# Patient Record
Sex: Female | Born: 1995 | Race: White | Hispanic: No | Marital: Single | Smoking: Never smoker
Health system: Southern US, Community
[De-identification: ages and names within clinical notes are randomized; demographics above are authoritative.]

## PROBLEM LIST (undated history)

## (undated) DIAGNOSIS — N920 Excessive and frequent menstruation with regular cycle: Secondary | ICD-10-CM

## (undated) DIAGNOSIS — D649 Anemia, unspecified: Secondary | ICD-10-CM

## (undated) DIAGNOSIS — R109 Unspecified abdominal pain: Secondary | ICD-10-CM

## (undated) DIAGNOSIS — H539 Unspecified visual disturbance: Secondary | ICD-10-CM

## (undated) DIAGNOSIS — F319 Bipolar disorder, unspecified: Secondary | ICD-10-CM

## (undated) HISTORY — PX: TYMPANOSTOMY TUBE PLACEMENT: SHX32

## (undated) HISTORY — DX: Unspecified abdominal pain: R10.9

## (undated) HISTORY — DX: Bipolar disorder, unspecified: F31.9

## (undated) HISTORY — PX: TOOTH EXTRACTION: SUR596

---
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
## 2015-04-13 ENCOUNTER — Ambulatory Visit (HOSPITAL_COMMUNITY): Payer: Self-pay | Admitting: Psychiatry

## 2015-07-23 ENCOUNTER — Encounter (HOSPITAL_COMMUNITY): Payer: Self-pay | Admitting: Nurse Practitioner

## 2015-07-23 ENCOUNTER — Emergency Department (HOSPITAL_COMMUNITY): Payer: 59

## 2015-07-23 ENCOUNTER — Emergency Department (HOSPITAL_COMMUNITY)
Admission: EM | Admit: 2015-07-23 | Discharge: 2015-07-23 | Disposition: A | Discharge status: Home / Self Care | Payer: 59 | Attending: Physician Assistant | Admitting: Physician Assistant

## 2015-07-23 DIAGNOSIS — Y998 Other external cause status: Secondary | ICD-10-CM | POA: Insufficient documentation

## 2015-07-23 DIAGNOSIS — F319 Bipolar disorder, unspecified: Secondary | ICD-10-CM | POA: Insufficient documentation

## 2015-07-23 DIAGNOSIS — Z79899 Other long term (current) drug therapy: Secondary | ICD-10-CM | POA: Insufficient documentation

## 2015-07-23 DIAGNOSIS — Y9289 Other specified places as the place of occurrence of the external cause: Secondary | ICD-10-CM | POA: Insufficient documentation

## 2015-07-23 DIAGNOSIS — Z8669 Personal history of other diseases of the nervous system and sense organs: Secondary | ICD-10-CM | POA: Insufficient documentation

## 2015-07-23 DIAGNOSIS — Y9339 Activity, other involving climbing, rappelling and jumping off: Secondary | ICD-10-CM | POA: Insufficient documentation

## 2015-07-23 DIAGNOSIS — Z862 Personal history of diseases of the blood and blood-forming organs and certain disorders involving the immune mechanism: Secondary | ICD-10-CM | POA: Insufficient documentation

## 2015-07-23 DIAGNOSIS — R0789 Other chest pain: Secondary | ICD-10-CM

## 2015-07-23 DIAGNOSIS — S299XXA Unspecified injury of thorax, initial encounter: Secondary | ICD-10-CM | POA: Insufficient documentation

## 2015-07-23 DIAGNOSIS — X58XXXA Exposure to other specified factors, initial encounter: Secondary | ICD-10-CM | POA: Insufficient documentation

## 2015-07-23 MED ORDER — IBUPROFEN 800 MG PO TABS: 800.0000 mg | ORAL_TABLET | Freq: Three times a day (TID) | ORAL | Status: AC

## 2015-07-23 MED ORDER — IBUPROFEN 800 MG PO TABS
800.0000 mg | ORAL_TABLET | Freq: Once | ORAL | Status: AC
  Administered 2015-07-23: 800 mg via ORAL
  Filled 2015-07-23: qty 1

## 2015-07-23 NOTE — Discharge Instructions (Signed)
1. Medications: ibuprofen, usual home medications 2. Treatment: rest, drink plenty of fluids, ice 3. Follow Up: please followup with your primary doctor for discussion of your diagnoses and further evaluation after today's visit; please return to the ER for severe pain, shortness of breath, new or worsening symptoms   Chest Wall Pain Chest wall pain is pain in or around the bones and muscles of your chest. Sometimes, an injury causes this pain. Sometimes, the cause may not be known. This pain may take several weeks or longer to get better. HOME CARE INSTRUCTIONS  Pay attention to any changes in your symptoms. Take these actions to help with your pain:   Rest as told by your health care provider.   Avoid activities that cause pain. These include any activities that use your chest muscles or your abdominal and side muscles to lift heavy items.   If directed, apply ice to the painful area:  Put ice in a plastic bag.  Place a towel between your skin and the bag.  Leave the ice on for 20 minutes, 2-3 times per day.  Take over-the-counter and prescription medicines only as told by your health care provider.  Do not use tobacco products, including cigarettes, chewing tobacco, and e-cigarettes. If you need help quitting, ask your health care provider.  Keep all follow-up visits as told by your health care provider. This is important. SEEK MEDICAL CARE IF:  You have a fever.  Your chest pain becomes worse.  You have new symptoms. SEEK IMMEDIATE MEDICAL CARE IF:  You have nausea or vomiting.  You feel sweaty or light-headed.  You have a cough with phlegm (sputum) or you cough up blood.  You develop shortness of breath.   This information is not intended to replace advice given to you by your health care provider. Make sure you discuss any questions you have with your health care provider.   Document Released: 09/02/2005 Document Revised: 05/24/2015 Document Reviewed:  11/28/2014 Elsevier Interactive Patient Education [employer]2016 Elsevier Inc.

## 2015-07-23 NOTE — ED Provider Notes (Signed)
CSN: [id number]     Arrival date & time 07/23/15  1349 History  By signing my name below, I, Elon Spanner, attest that this documentation has been prepared under the direction and in the presence of Glean Hess, [place]. Electronically Signed: Elon Spanner ED Scribe. 07/23/2015. 4:42 PM.    Chief Complaint  Patient presents with  . Flank Pain    The history is provided by the patient. No language interpreter was used.    HPI Comments: Karla Hill is a 19 y.o. female who presents to the Emergency Department complaining of left rib pain onset 1 hour ago after being tackled while playing quidditch. Her pain is worse with movement and deep inspiration. She has not tried anything for symptom relief. She reports she tore her rib cartilage 1 month ago while playing quidditch. She denies head trauma, LOC, additional injury, abdominal pain, n/v, shortness of breath, numbness, paresthesia, weakness, neck pain, back pain.     Past Medical History  Diagnosis Date  . Abdominal pain   . Anemia   . Vision abnormalities     wears glassess  . Menorrhagia     anemia- takes Safyral  . Bipolar disorder [facility])    Past Surgical History  Procedure Laterality Date  . Tympanostomy tube placement    . Tooth extraction      4 for braces.  . Esophagogastroduodenoscopy N/A 01/08/2013    Procedure: ESOPHAGOGASTRODUODENOSCOPY (EGD);  Surgeon: Jon Gills, MD;  Location[facility] OR;  Service: Gastroenterology;  Laterality: N/A;   Family History  Problem Relation Age of Onset  . Thyroid disease Mother   . Migraines Mother   . Hyperlipidemia Mother   . Hypertension Mother   . GER disease Father   . Ulcers Paternal Uncle   . Alcohol abuse Maternal Aunt   . Drug abuse Maternal Aunt   . Depression Maternal Aunt   . Mental illness Maternal Aunt   . Heart disease Maternal Grandmother    Social History  Substance Use Topics  . Smoking status: Never Smoker   . Smokeless tobacco: Never Used  . Alcohol  Use: Yes   OB History    No data available       Review of Systems  Respiratory: Negative for shortness of breath.   Cardiovascular: Negative for chest pain.  Gastrointestinal: Negative for nausea, vomiting and abdominal pain.  Musculoskeletal: Positive for arthralgias. Negative for back pain and neck pain.  Neurological: Negative for syncope, weakness and numbness.      Allergies  Review of patient's allergies indicates no known allergies.  Home Medications   Prior to Admission medications   Medication Sig Start Date End Date Taking? Authorizing Provider  Alum & Mag Hydroxide-Simeth (MAGIC MOUTHWASH W/LIDOCAINE) SOLN Take 5 mLs by mouth 4 (four) times daily as needed for mouth pain. 07/22/13   Shade Flood, MD  divalproex (DEPAKOTE) 500 MG DR tablet Take 500 mg by mouth 3 (three) times daily.    Historical Provider, MD  Drospirenone-Ethinyl Estradiol-Levomefol (SAFYRAL) 3-0.03-0.451 MG tablet Take 1 tablet by mouth daily.    Historical Provider, MD  esomeprazole (NEXIUM) 40 MG capsule Take 1 capsule (40 mg total) by mouth daily before breakfast. 04/28/13   Jon Gills, MD  HYDROcodone-acetaminophen (NORCO/VICODIN) 5-325 MG per tablet Take 1 tablet by mouth every 6 (six) hours as needed for moderate pain. 07/22/13   Shade Flood, MD  ibuprofen (ADVIL,MOTRIN) 800 MG tablet Take 1 tablet (800 mg total) by mouth  3 (three) times daily. 07/23/15   Mady GemmaElizabeth C Westfall, PA-C  lamoTRIgine (LAMICTAL) 100 MG tablet Take 100 mg by mouth daily.    Historical Provider, MD    BP 108/59 mmHg  Pulse 77  Temp(Src) 97.9 F (36.6 C) (Oral)  Resp 16  SpO2 99%  LMP 07/23/2015 Physical Exam  Constitutional: She is oriented to person, place, and time. She appears well-developed and well-nourished. No distress.  HENT:  Head: Normocephalic and atraumatic.  Right Ear: External ear normal.  Left Ear: External ear normal.  Nose: Nose normal.  Mouth/Throat: Oropharynx is clear and moist.   Eyes: Conjunctivae and EOM are normal. Pupils are equal, round, and reactive to light. Right eye exhibits no discharge. Left eye exhibits no discharge. No scleral icterus.  Neck: Normal range of motion. Neck supple.  Cardiovascular: Normal rate, regular rhythm, normal heart sounds and intact distal pulses.   Pulmonary/Chest: Effort normal and breath sounds normal. No respiratory distress. She has no wheezes. She has no rales. She exhibits tenderness and bony tenderness. She exhibits no crepitus, no edema, no deformity, no swelling and no retraction.    Abdominal: Soft. Bowel sounds are normal. She exhibits no distension and no mass. There is no tenderness. There is no rebound and no guarding.  Musculoskeletal: She exhibits no edema or tenderness.  Decreased ROM of LUE due to pain in left chest wall.  Neurological: She is alert and oriented to person, place, and time. She has normal strength. No sensory deficit.  Skin: Skin is warm and dry. She is not diaphoretic.  Psychiatric: She has a normal mood and affect. Her behavior is normal.  Nursing note and vitals reviewed.   ED Course  Procedures (including critical care time)  DIAGNOSTIC STUDIES: Oxygen Saturation is 99% on RA, normal by my interpretation.    COORDINATION OF CARE: 3:44 PM Will order imaging.  Patient acknowledges and agrees with plan.    Labs Review Labs Reviewed - No data to display  Imaging Review Dg Ribs Unilateral W/chest Left  07/23/2015  CLINICAL DATA:  Patient with left lateral rib pain status post fall during soccer game. Initial encounter. EXAM: LEFT RIBS AND CHEST - 3+ VIEW COMPARISON:  None. FINDINGS: Normal cardiac and mediastinal contours. No consolidative pulmonary opacities. No pleural effusion or pneumothorax. Regional skeleton is unremarkable. No evidence for acute displaced left lateral rib fracture. IMPRESSION: No acute cardiopulmonary process. No evidence for acute displaced left lateral rib fracture.  Electronically Signed   By: Annia Beltrew  Davis M.D.   On: 07/23/2015 16:19     I have personally reviewed and evaluated these images as part of my medical decision-making.   EKG Interpretation None      MDM   Final diagnoses:  Left-sided chest wall pain    19 year old female presents with left rib pain after being tackled while playing quidditch. Denies head trauma, LOC, additional injury, abdominal pain, n/v, numbness, paresthesia, weakness, neck pain, back pain.  Patient is afebrile. Vital signs stable. Heart RRR. Lungs clear to auscultation bilaterally. TTP of left lateral chest wall over mid-axillary line. No crepitus or palpable deformity. Decreased range of motion of left arm due to pain in left chest wall. Strength and sensation intact. Distal pulses intact.  Given ibuprofen and ice for pain. Will obtain imaging of left ribs and chest. Imaging negative for opacity, pleural effusion, pneumothorax, or acute displaced left lateral rib fracture.  Advised patient to continue ice and ibuprofen for pain and inflammation. Return  precautions discussed. Patient to follow-up with PCP. Patient verbalizes her understanding and is in agreement with plan.  BP 108/59 mmHg  Pulse 77  Temp(Src) 97.9 F (36.6 C) (Oral)  Resp 16  SpO2 99%  LMP 07/23/2015  I personally performed the services described in this documentation, which was scribed in my presence. The recorded information has been reviewed and is accurate.    Mady Gemma, PA-C 07/23/15 1642  Courteney Randall An, MD 07/23/15 2123

## 2015-07-23 NOTE — ED Notes (Signed)
Pt c/o left sided flank pain secondary to being hit while playing quidditch sport, states she had a cartiladge tear on the same side. Denies shortness of breath or chest pain.

## 2016-02-01 ENCOUNTER — Other Ambulatory Visit: Payer: Self-pay | Admitting: Obstetrics and Gynecology

## 2016-08-11 IMAGING — CR DG RIBS W/ CHEST 3+V*L*
3 series · 3 of 3 positions shown · non-contrast
Comparison: None.

CLINICAL DATA: Patient with left lateral rib pain status post fall
during soccer game. Initial encounter.

EXAM:
LEFT RIBS AND CHEST - 3+ VIEW

[w chest pa]
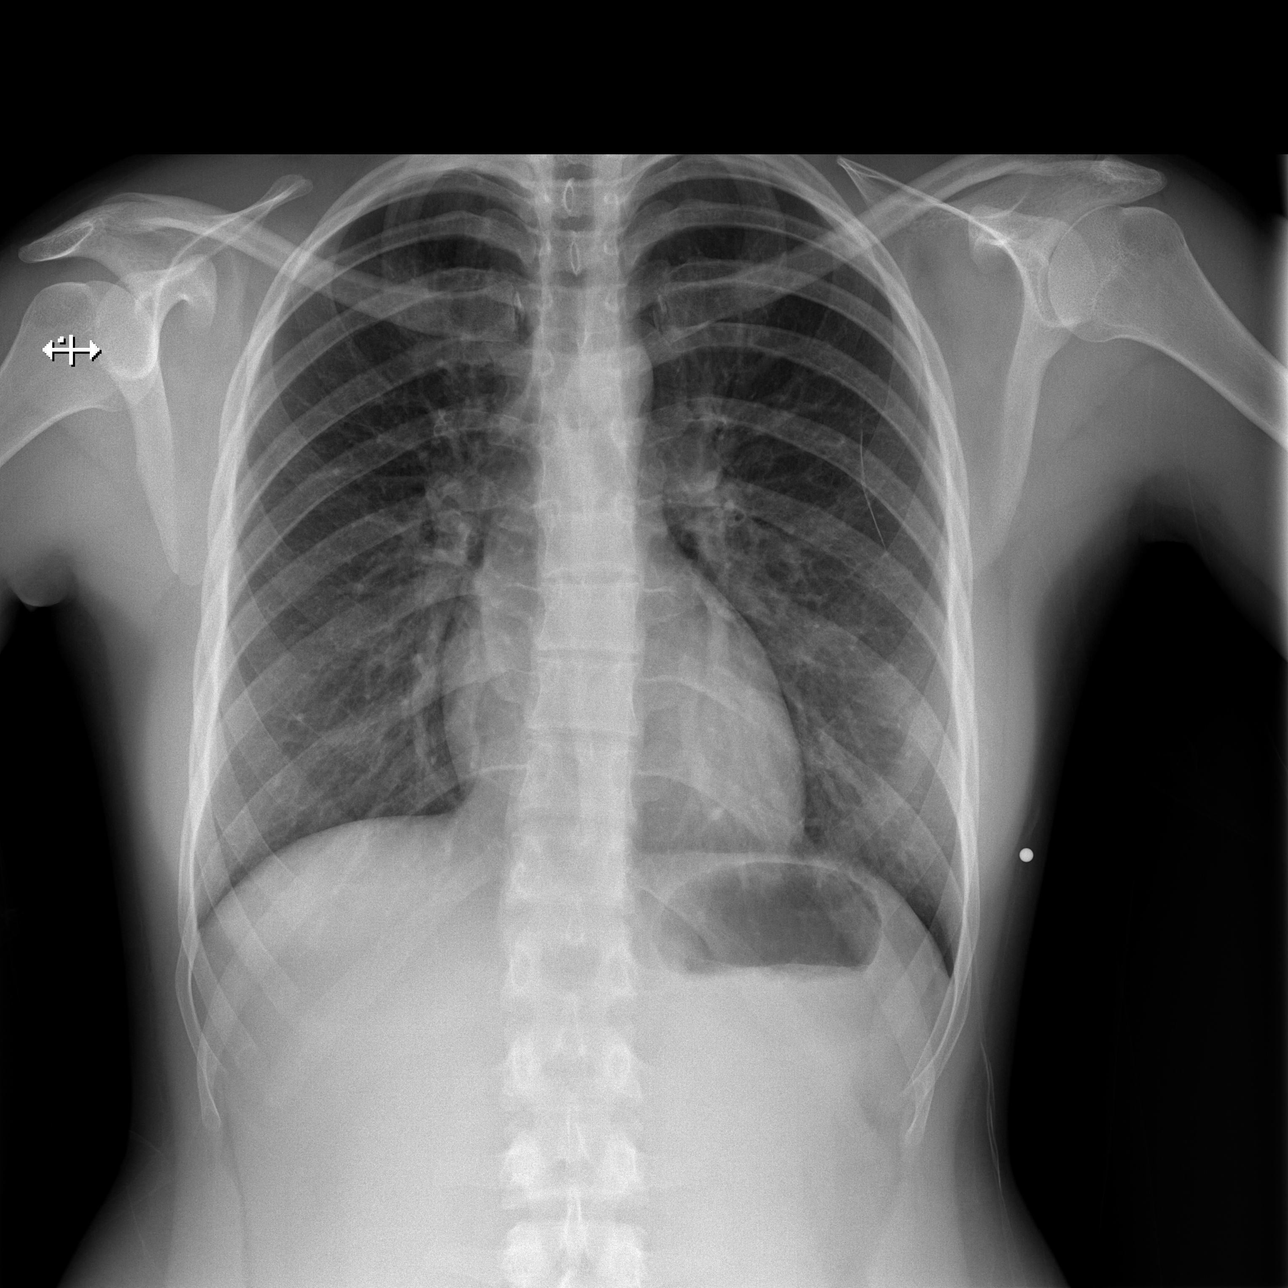

[w ribs ap upper left]
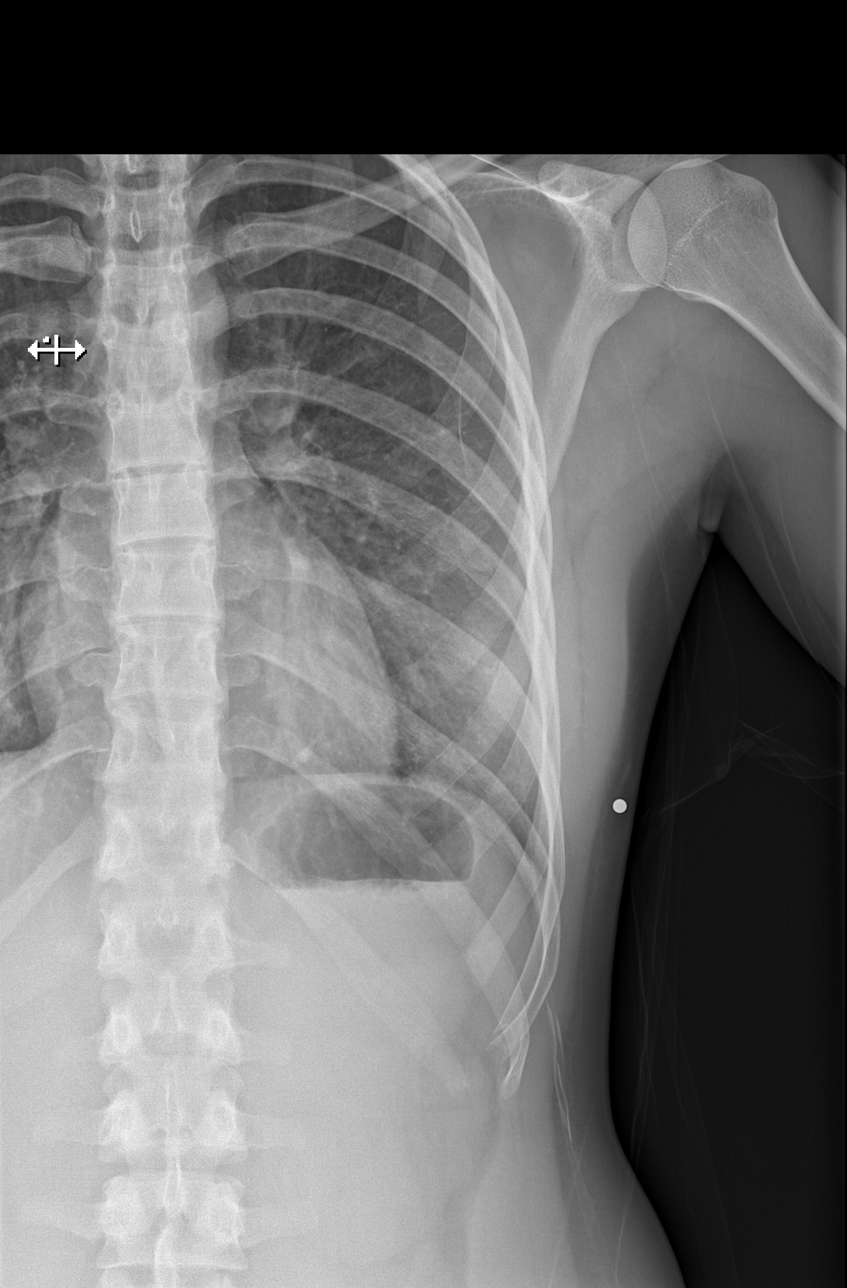

[w ribs ap lower left]
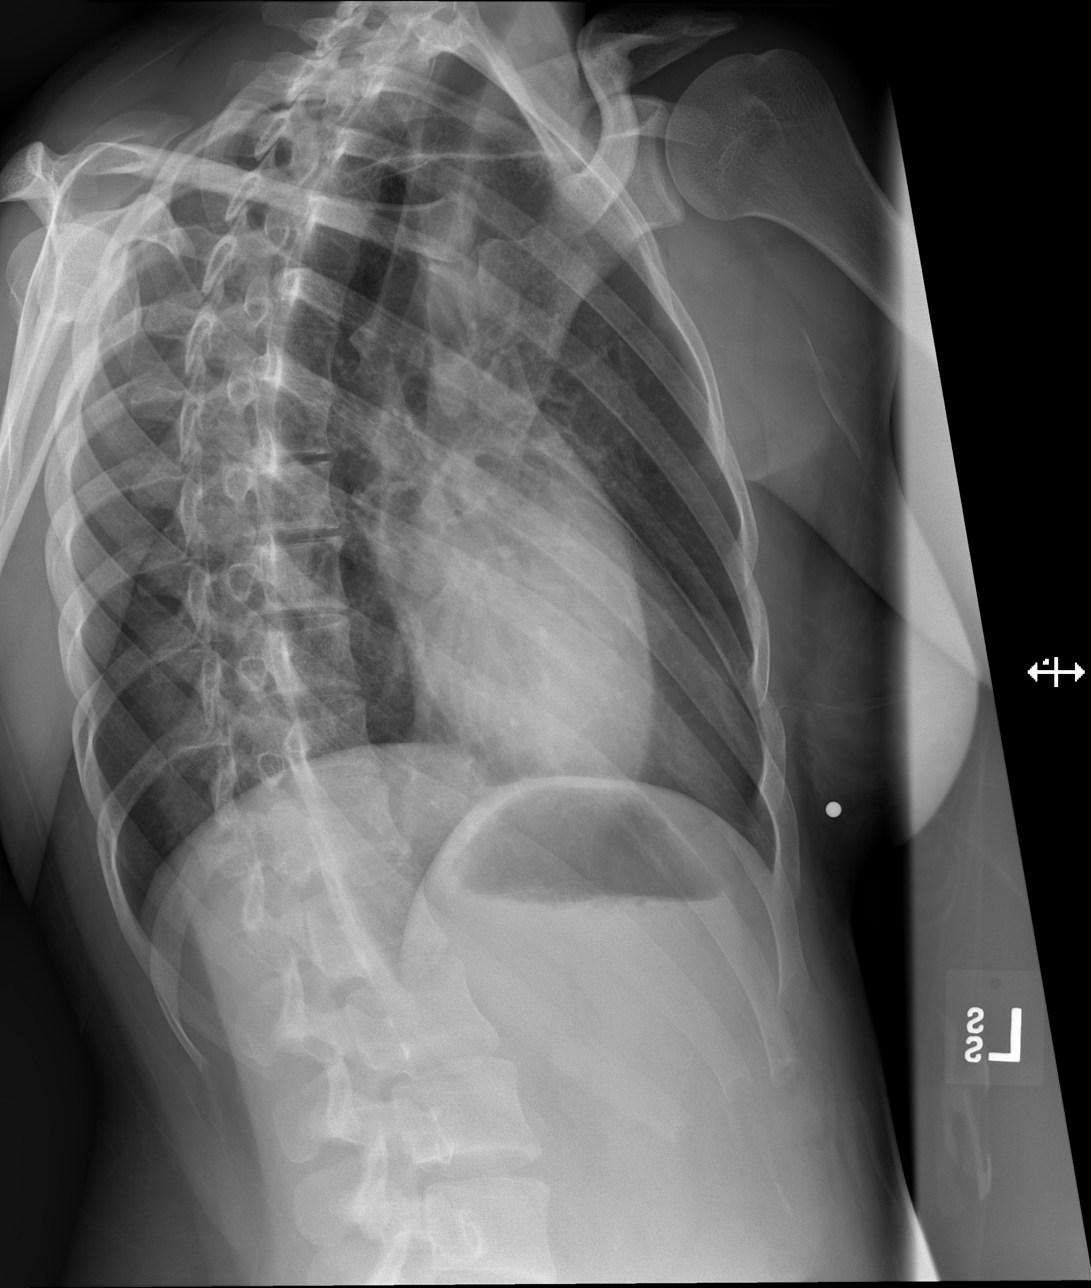

[3 of 3 positions shown; findings below may reference images not displayed]

FINDINGS: Normal cardiac and mediastinal contours. No consolidative pulmonary
opacities. No pleural effusion or pneumothorax. Regional skeleton is
unremarkable. No evidence for acute displaced left lateral rib
fracture.
IMPRESSION: No acute cardiopulmonary process. No evidence for acute displaced
left lateral rib fracture.

## 2018-06-13 ENCOUNTER — Encounter: Payer: Self-pay | Admitting: Behavioral Health

## 2018-06-13 DIAGNOSIS — F3181 Bipolar II disorder: Secondary | ICD-10-CM | POA: Insufficient documentation

## 2018-06-13 DIAGNOSIS — F410 Panic disorder [episodic paroxysmal anxiety] without agoraphobia: Secondary | ICD-10-CM | POA: Insufficient documentation

## 2018-06-29 ENCOUNTER — Ambulatory Visit: Payer: Self-pay | Admitting: Psychiatry

## 2018-07-02 ENCOUNTER — Encounter: Payer: Self-pay | Admitting: Psychiatry

## 2018-07-02 ENCOUNTER — Ambulatory Visit: Payer: 59 | Admitting: Psychiatry

## 2018-07-02 VITALS — BP 118/74 | HR 80 | Ht 64.5 in | Wt 152.0 lb

## 2018-07-02 DIAGNOSIS — F410 Panic disorder [episodic paroxysmal anxiety] without agoraphobia: Secondary | ICD-10-CM | POA: Diagnosis not present

## 2018-07-02 DIAGNOSIS — F3181 Bipolar II disorder: Secondary | ICD-10-CM | POA: Diagnosis not present

## 2018-07-02 MED ORDER — LAMOTRIGINE 200 MG PO TABS: 200.0000 mg | ORAL_TABLET | Freq: Every day | ORAL | 3 refills | Status: DC

## 2018-07-02 NOTE — Progress Notes (Signed)
[facility] Med Check  Patient ID: Karla Hill,  MRN: [id number]  PCP: Karla Lek, MD  Date of Evaluation: 07/02/2018 Time spent:20 minutes   HISTORY/CURRENT STATUS: HPI  Past medications include Depakote, Seroquel, Phenergan, and more recently Xanax not needed over the last year, Geodon, Risperdal, and Lamictal.  Individual Medical History/ Review of Systems: Changes? :Yes. Karla Hill is seen individually face-to-face with consent not collateral for psychiatric interview and exam in 47-month evaluation and management of bipolar II and panic disorder.  She has had no interim panic or required Xanax despite having much stress.  After the death of grandmother last 26-Feb-2023, a family friend died and then mother's best friend has recently had an aneurysmal stroke.  Patient is discussing future marriage with boyfriend and she accepted a job in [place] when offered 2 days ago.  She will finish her college in [place] in the spring and likely move to [place] in May.  She has had recent headaches for the last month or 2 with neck tension and tightness becoming a more significant generalized headache for which she has a plan to see primary care physician who may repeat her prolactin, thyroid and other health screens as she has been on Risperdal 0.25 mg nightly for over the last 10 months.  She asks about minimizing medications for her transfer and future plans.  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  There is maternal family history of bipolar, aggression, and substance abuse.  She continues birth control pill and has had some hypertriglyceridemia as well.  She has no chance of pregnancy and flow to accomplish but has definite plans for health maintenance since PCP Karla Hill moved and retired.  Allergies: Patient has no known allergies.  Current Medications:  Current Outpatient Medications:  .  ALPRAZolam (XANAX) 0.5 MG  tablet, Take 0.5 mg by mouth daily as needed for anxiety., Disp: , Rfl:  .  Alum & Mag Hydroxide-Simeth (MAGIC MOUTHWASH W/LIDOCAINE) SOLN, Take 5 mLs by mouth 4 (four) times daily as needed for mouth pain., Disp: 120 mL, Rfl: 0 .  divalproex (DEPAKOTE) 500 MG DR tablet, Take 500 mg by mouth 3 (three) times daily., Disp: , Rfl:  .  Drospirenone-Ethinyl Estradiol-Levomefol (SAFYRAL) 3-0.03-0.451 MG tablet, Take 1 tablet by mouth daily., Disp: , Rfl:  .  esomeprazole (NEXIUM) 40 MG capsule, Take 1 capsule (40 mg total) by mouth daily before breakfast., Disp: 30 capsule, Rfl: 5 .  HYDROcodone-acetaminophen (NORCO/VICODIN) 5-325 MG per tablet, Take 1 tablet by mouth every 6 (six) hours as needed for moderate pain., Disp: 15 tablet, Rfl: 0 .  ibuprofen (ADVIL,MOTRIN) 800 MG tablet, Take 1 tablet (800 mg total) by mouth 3 (three) times daily., Disp: 21 tablet, Rfl: 0 .  lamoTRIgine (LAMICTAL) 200 MG tablet, Take 1 tablet (200 mg total) by mouth at bedtime., Disp: 90 tablet, Rfl: 3 .  risperiDONE (RISPERDAL) 0.25 MG tablet, Take 0.25 mg by mouth at bedtime., Disp: , Rfl:  Medication Side Effects: Other: As prolactin barely elevated 15.7 with upper limit of normal 14.4 at [facility] in August 2017  Family Medical/ Social History: Changes? Yes .  Karla Hill is supportive of patient's plans for excepting the job in [place], having the family history of bipolar.  MENTAL HEALTH EXAM: Dyshidrotic eczema of the palms, BCP, hypertriglyceridemia, and headaches otherwise 3 systems negative. Blood pressure 118/74, pulse 80, height 5' 4.5" (1.638 m), weight 152 lb (68.9 kg).Body mass  index is 25.69 kg/m.  General Appearance: Casual and Well Groomed  Eye Contact:  Good  Speech:  Clear and Coherent  Volume:  Normal  Mood:  Anxious and Euthymic  Affect:  Full Range  Thought Process:  Goal Directed  Orientation:  Full (Time, Place, and Person)  Thought Content: Logical   Suicidal Thoughts:  No  Homicidal Thoughts:   No  Memory:  Immediate  Judgement:  Good  Insight:  Fair  Psychomotor Activity:  Normal  Concentration:  Concentration: Good and Attention Span: Good  Recall:  Good  Fund of Knowledge: Good  Language: Good  Akathisia:  No  AIMS (if indicated): done = 0  Assets:  Desire for Improvement Resilience Talents/Skills Transportation Vocational/Educational  ADL's:  Intact  Cognition: WNL  Prognosis:  Good    DIAGNOSES:    ICD-10-CM   1. Bipolar II disorder, severe, depressed, with rapid cycling, in full remission (HCC) F31.81 lamoTRIgine (LAMICTAL) 200 MG tablet  2. Panic disorder F41.0     RECOMMENDATIONS: Karla Hill has 6 to 9 months schedule to see dermatology for hand eczema, primary care for headaches, to recheck labs including prolactin and thyroid off Risperdal if possible, and to establish optimal medication for transfer to [place] to full-time job and likely future marriage.  Issues are addressed including tapering Risperdal to one half of 0.25 mg tablet for 1 month and then discontinue to reassess her mood starting 1 month later and call if any problems otherwise return here in 6 months and preparing for her move.  She plans labs with PCP and assessing headaches and will continue Lamictal 200 mg nightly year supply as [facility]-day [facility] [place].  She has not needed Xanax and has a supply of so needed at 0.5 mg only as needed for panic.    Chauncey Mann, MD

## 2019-04-18 ENCOUNTER — Other Ambulatory Visit: Payer: Self-pay | Admitting: Psychiatry

## 2019-04-18 DIAGNOSIS — F3181 Bipolar II disorder: Secondary | ICD-10-CM

## 2019-04-18 NOTE — Telephone Encounter (Signed)
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  She will need to continue the Lamictal but hopefully not the Risperal and cannot stop the Lamictal abruptly so 90-day supply with no refill [facility] on [address].

## 2019-04-18 NOTE — Telephone Encounter (Signed)
Last visit 06/2018 nothing scheduled 

## 2019-07-12 ENCOUNTER — Other Ambulatory Visit: Payer: Self-pay | Admitting: Psychiatry

## 2019-07-12 DIAGNOSIS — F3181 Bipolar II disorder: Secondary | ICD-10-CM

## 2019-07-12 NOTE — Telephone Encounter (Signed)
Last appointment 07/04/2018 having moved to [place] with new job after school ended signing to seek ongoing care here rather than transfer to a provider in [place].  She makes appointment for 07/14/2019 so that Lamictal 200 mg every bedtime is sent as a [facility] [place].

## 2019-07-12 NOTE — Telephone Encounter (Signed)
Needs appt

## 2019-07-14 ENCOUNTER — Encounter: Payer: Self-pay | Admitting: Psychiatry

## 2019-07-14 ENCOUNTER — Other Ambulatory Visit: Payer: Self-pay

## 2019-07-14 ENCOUNTER — Ambulatory Visit (INDEPENDENT_AMBULATORY_CARE_PROVIDER_SITE_OTHER): Payer: 59 | Admitting: Psychiatry

## 2019-07-14 VITALS — Ht 64.5 in | Wt 145.0 lb

## 2019-07-14 DIAGNOSIS — F3181 Bipolar II disorder: Secondary | ICD-10-CM

## 2019-07-14 DIAGNOSIS — F410 Panic disorder [episodic paroxysmal anxiety] without agoraphobia: Secondary | ICD-10-CM | POA: Diagnosis not present

## 2019-07-14 MED ORDER — LAMOTRIGINE 200 MG PO TABS: 200.0000 mg | ORAL_TABLET | Freq: Every day | ORAL | 3 refills | Status: DC

## 2019-07-14 NOTE — Progress Notes (Signed)
[facility]  Patient ID: Karla Hill,  MRN: [id number]  PCP: Delorse LekBurnett, Brent A, MD  Date of Evaluation: 07/14/2019 Time spent:20 minutes from 0930 to 0950  Chief Complaint:  Chief Complaint    Depression; Manic Behavior; Panic Attack      HISTORY/CURRENT STATUS: Karla ClientHannah is provided telemedicine audiovisual appointment session, declining the video camera for panic disorder, phone to phone individually with consent with epic collateral for psychiatric interview and exam in 3464-month evaluation and management of bipolar II and panic disorder treated by Dr. Tomasa Randunningham from 2014 changing to my care with his departure in 2016.  Over 4 years of my care, the patient resisted stopping Risperdal despite symptom containment and elevated prolactin at 100.9 in December 2016.  She did not allow a repeat prolactin until by PCP in August 2017 normal at 15.  She stopped Risperdal 2 years later tapering from last appointment over 2 months so she continues Lamictal 200 mg nightly as her only psychiatric medication other than as needed Xanax having supply, none per [place] registry, from last fill 06/13/2018 understanding this may be medically expired.  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  She is now comfortable with all aspects of her medical wellbeing taking Xanax infrequently having current supply she considers sufficient.  She is stressed that her dog may have bone cancer.  Weight is down 7 pounds from last year's appointment.  She has no psychosis, mania, suicidality, or delirium.  Depression      The patient presents with depression.  This is a recurrent problem.  The current episode started more than 1 year ago.   The onset quality is sudden.   The problem occurs intermittently.  The problem has been resolved since onset.  Associated symptoms include no decreased  concentration, no hopelessness, does not have insomnia, not irritable, no restlessness, no body aches, no headaches and not sad.     The symptoms are aggravated by work stress, social issues and family issues.  Past treatments include other medications and psychotherapy.  Compliance with treatment is variable.  Past compliance problems include medication issues and medical issues.  Previous treatment provided significant relief.  Risk factors include a change in medication usage/dosage, family history, family history of mental illness, major life event and stress.   Past medical history includes anxiety, bipolar disorder, depression, mental health disorder and head trauma.     Pertinent negatives include no life-threatening condition, no physical disability, no recent psychiatric admission, no eating disorder, no obsessive-compulsive disorder, no post-traumatic stress disorder, no schizophrenia and no suicide attempts.   Individual Medical History/ Review of Systems: Changes? :Yes At last appointment here 1 year ago, patient planned Derm consult for hand eczema, PCP for migraine, and GYN relative to marriage planned.  She was off Risperdal by December 2019 seeing neurology first 10/05/2018 headache considered primarily muscular with cervical spine x-ray negative last following up 12/22/2018 telemedicine on Skelaxin, naproxen, birth control pill, and starting amitriptyline 25 mg at bedtime MRI of the brain normal in May except left maxillary sinus disease.  Allergies: Patient has no known allergies.  Current Medications:  Current Outpatient Medications:  .  ALPRAZolam (XANAX) 0.5 MG tablet, Take 0.5 mg by mouth daily as needed for anxiety., Disp: , Rfl:  .  Alum & Mag Hydroxide-Simeth (MAGIC MOUTHWASH W/LIDOCAINE) SOLN, Take 5 mLs by mouth 4 (four) times  daily as needed for mouth pain., Disp: 120 mL, Rfl: 0 .  Drospirenone-Ethinyl Estradiol-Levomefol (SAFYRAL) 3-0.03-0.451 MG tablet, Take 1 tablet by mouth  daily., Disp: , Rfl:  .  esomeprazole (NEXIUM) 40 MG capsule, Take 1 capsule (40 mg total) by mouth daily before breakfast., Disp: 30 capsule, Rfl: 5 .  HYDROcodone-acetaminophen (NORCO/VICODIN) 5-325 MG per tablet, Take 1 tablet by mouth every 6 (six) hours as needed for moderate pain., Disp: 15 tablet, Rfl: 0 .  ibuprofen (ADVIL,MOTRIN) 800 MG tablet, Take 1 tablet (800 mg total) by mouth 3 (three) times daily., Disp: 21 tablet, Rfl: 0 .  lamoTRIgine (LAMICTAL) 200 MG tablet, Take 1 tablet (200 mg total) by mouth at bedtime., Disp: 90 tablet, Rfl: 3 Medication Side Effects: none  Family Medical/ Social History: Changes? No  MENTAL HEALTH EXAM:  Height 5' 4.5" (1.638 m), weight 145 lb (65.8 kg).Body mass index is 24.5 kg/m. Muscle strengths and tone 5/5, postural reflexes and gait 0/0, and AIMS = 0 by neurology at 2 months off risperidone and in follow-up 6 months ago  General Appearance: N/A  Eye Contact:  N/A  Speech:  Clear and Coherent, Normal Rate and Talkative  Volume:  Normal  Mood:  Anxious and Euthymic  Affect:  Congruent, Inappropriate, Labile and Anxious  Thought Process:  Coherent, Goal Directed and Descriptions of Associations: Tangential  Orientation:  Full (Time, Place, and Person)  Thought Content: Rumination and Tangential   Suicidal Thoughts:  No  Homicidal Thoughts:  No  Memory:  Immediate;   Good Remote;   Good  Judgement:  Good  Insight:  Good to fair still fusion with family   Psychomotor Activity:  N/A  Concentration:  Concentration: Good and Attention Span: Good  Recall:  Good  Fund of Knowledge: Good  Language: Good  Assets:  Desire for Improvement Leisure Time Resilience Talents/Skills Vocational/Educational  ADL's:  Intact  Cognition: WNL  Prognosis:  Good    DIAGNOSES:    ICD-10-CM   1. Bipolar II disorder, severe, depressed, with rapid cycling, in full remission (HCC)  F31.81 lamoTRIgine (LAMICTAL) 200 MG tablet  2. Panic disorder  F41.0      Receiving Psychotherapy: No    RECOMMENDATIONS: Over 50% of the 20-minute session phone to phone is spent in 10 minutes total of counseling and coordination of care to integrating past to present neuropsychiatric, neurohumoral, and psychodynamic determinants of symptoms.  Cognitive behavioral social skills, anger management, and self-regulation interventions are updated putting in symptom treatment matching.  She is E scribed Lamictal 200 mg every bedtime sent as #90 with 3 refills to [facility] on [address] and [place] in [place] for bipolar and panic disorders.  We will cancel the eScription of 07/12/2019 for [facility] as she wishes to switch pharmacies and will not pick that up.  She has current supply of Xanax 0.5 mg daily as needed for panic.  She plans return in 1 year unless she changes health systems completely to obtain a provider in [place].  Virtual Visit via Video Note  I connected with Karla Hill on 07/14/19 at  9:20 AM EDT by a video enabled telemedicine application and verified that I am speaking with the correct person using two identifiers.  Location: Patient: Individually by audio declining video for panic disorder at home residence Provider: [facility]   I discussed the limitations of evaluation and management by telemedicine and the availability of in person appointments. The patient  expressed understanding and agreed to proceed.  History of Present Illness: 42-month evaluation and management address bipolar II and panic disorder treated by Dr. Candis Schatz from 2014 changing to my care with his departure in 2016.  Over 4 years of my care, the patient resisted stopping Risperdal despite symptom containment and elevated prolactin at 100.9 in December 2016.  She did not allow a repeat prolactin until by PCP in August 2017 normal at 15.  She stopped Risperdal 2 years later tapering from last appointment over 2 months     Observations/Objective: Mood:  Anxious and Euthymic  Affect:  Congruent, Inappropriate, Labile and Anxious  Thought Process:  Coherent, Goal Directed and Descriptions of Associations: Tangential   Assessment and Plan: Over 50% of the 20-minute session phone to phone is spent in 10 minutes total of counseling and coordination of care to integrating past to present neuropsychiatric, neurohumoral, and psychodynamic determinants of symptoms.  Cognitive behavioral social skills, anger management, and self-regulation interventions are updated putting in symptom treatment matching.  She is E scribed Lamictal 200 mg every bedtime sent as #90 with 3 refills to [facility] on [address] in [place] for bipolar and panic disorders.  We will cancel the eScription of 07/12/2019 for [facility] as she wishes to switch pharmacies and will not pick that up.  She has current supply of Xanax 0.5 mg daily as needed for panic  Follow Up Instructions: She plans return in 1 year unless she changes health systems completely to obtain a provider in [place].    I discussed the assessment and treatment plan with the patient. The patient was provided an opportunity to ask questions and all were answered. The patient agreed with the plan and demonstrated an understanding of the instructions.   The patient was advised to call back or seek an in-person evaluation if the symptoms worsen or if the condition fails to improve as anticipated.  I provided 20 minutes of non-face-to-face time during this encounter. [employer] meeting #[id number] Meeting password: [email or web address] [phone]  Delight Hoh, MD  Delight Hoh, MD

## 2019-12-11 ENCOUNTER — Emergency Department: Admit: 2019-12-12 | Payer: PRIVATE HEALTH INSURANCE

## 2019-12-11 DIAGNOSIS — S56911A Strain of unspecified muscles, fascia and tendons at forearm level, right arm, initial encounter: Secondary | ICD-10-CM

## 2019-12-11 NOTE — ED Notes (Signed)
Ice pack provided     Shela Leff, RN  12/11/19 2259

## 2019-12-11 NOTE — ED Provider Notes (Signed)
[facility]  [phone] [facility] [address]  [facility] [place] [address]  Phone: [phone]  Fax: [phone]    EMERGENCY DEPARTMENT ENCOUNTER          Pt Name: April Romero  MRN: [id number]  Birthdate 06-09-1996  Date of evaluation: 12/11/2019      CHIEF COMPLAINT       Chief Complaint   Patient presents with   ??? Arm Injury     right       HISTORY OF PRESENT ILLNESS       April Romero is a 24 y.o. female who presents with pain to her right wrist and right elbow.  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  Denies falling or any other trauma.  Occurred earlier this evening.  She arrived wearing a makeshift splint from a shirt.  Denies other symptoms, injuries or concerns.  No recent analgesics.    REVIEW OF SYSTEMS       Review of Systems   Constitutional: Negative for chills, fatigue and fever.   HENT: Negative for rhinorrhea and sore throat.    Eyes: Negative for pain.   Respiratory: Negative for cough and shortness of breath.    Cardiovascular: Negative for chest pain.   Gastrointestinal: Negative for abdominal pain, diarrhea, nausea and vomiting.   Genitourinary: Negative for difficulty urinating.   Musculoskeletal: Negative for back pain and neck pain.   Skin: Negative for rash.   Neurological: Negative for weakness and headaches.          PAST MEDICAL HISTORY    has no past medical history on file.    SURGICAL HISTORY      has no past surgical history on file.    CURRENT MEDICATIONS       Current Discharge Medication List      CONTINUE these medications which have NOT CHANGED    Details   Drospiren-Eth Estrad-Levomefol (TYDEMY) 3-0.03-0.451 MG TABS Take by mouth      lamoTRIgine (LAMICTAL) 200 MG tablet Take 200 mg by mouth daily             ALLERGIES     has No Known Allergies.    FAMILY HISTORY     has no family status information on file.      family history is not on file.    SOCIAL  HISTORY      reports that she has never smoked. She does not have any smokeless tobacco history on file. She reports that she does not drink alcohol or use drugs.    PHYSICAL EXAM     INITIAL VITALS:  weight is 61.2 kg (135 lb). Her oral temperature is 98.3 ??F (36.8 ??C). Her blood pressure is 126/92 (abnormal) and her pulse is 88. Her respiration is 14 and oxygen saturation is 100%.      Physical Exam   Constitutional: She appears well-developed and well-nourished.  Non-toxic appearance. She does not appear ill. No distress.   HENT:   Head: Normocephalic and atraumatic.   Right Ear: External ear normal.   Left Ear: External ear normal.   Eyes: EOM and lids are normal.   Neck: No tracheal deviation present.   Cardiovascular: Normal rate and regular rhythm.   Pulmonary/Chest: Effort normal and breath sounds normal. No respiratory distress.   Abdominal: Soft. There is no abdominal tenderness.   Musculoskeletal:  Comments: Mild tenderness at the proximal dorsal wrist.  No edema, erythema, skin lesions or deformities.  Mild tenderness to the radial aspect of the elbow joint.  Otherwise unremarkable elbow exam.  Normal range of motion of all joints.  Normal distal pulses, motor and sensation.   Neurological: She is alert. GCS eye subscore is 4. GCS verbal subscore is 5. GCS motor subscore is 6.   Skin: Skin is warm and dry.   Psychiatric: She has a normal mood and affect. Her speech is normal.   Vitals reviewed.    DIFFERENTIAL DIAGNOSIS/ MDM:     Plan to be to image the elbow and wrist.    DIAGNOSTIC RESULTS     EKG: All EKG's are interpreted by the Emergency Department Physician who either signs or Co-signs this chart in the absence of a cardiologist.        RADIOLOGY:   I directly visualized the following  images and reviewed the radiologist interpretations:  XR ELBOW RIGHT (MIN 3 VIEWS)   Final Result   No acute bony abnormality of the elbow or wrist.         XR WRIST RIGHT (MIN 3 VIEWS)   Final Result   No acute  bony abnormality of the elbow or wrist.             Xr Elbow Right (min 3 Views)    Result Date: 12/11/2019  EXAMINATION: 4 XRAY VIEWS OF THE RIGHT WRIST; THREE XRAY VIEWS OF THE RIGHT ELBOW 12/11/2019 11:06 pm COMPARISON: None. HISTORY: ORDERING SYSTEM PROVIDED HISTORY: Pain TECHNOLOGIST PROVIDED HISTORY: Pain Reason for Exam: fall Acuity: Acute Type of Exam: Initial FINDINGS: Wrist: No acute fracture or dislocation.  No focal osseous lesion.  No abnormal soft tissue findings.  The carpal bones are well-aligned. Elbow: No joint effusion.  No acute fracture or dislocation.  The radial head appears intact.  No focal soft tissue abnormality.     No acute bony abnormality of the elbow or wrist.     Xr Wrist Right (min 3 Views)    Result Date: 12/11/2019  EXAMINATION: 4 XRAY VIEWS OF THE RIGHT WRIST; THREE XRAY VIEWS OF THE RIGHT ELBOW 12/11/2019 11:06 pm COMPARISON: None. HISTORY: ORDERING SYSTEM PROVIDED HISTORY: Pain TECHNOLOGIST PROVIDED HISTORY: Pain Reason for Exam: fall Acuity: Acute Type of Exam: Initial FINDINGS: Wrist: No acute fracture or dislocation.  No focal osseous lesion.  No abnormal soft tissue findings.  The carpal bones are well-aligned. Elbow: No joint effusion.  No acute fracture or dislocation.  The radial head appears intact.  No focal soft tissue abnormality.     No acute bony abnormality of the elbow or wrist.       LABS:  No results found for this visit on 12/11/19.    EMERGENCY DEPARTMENT COURSE:     The patient was given the following medications:  No orders of the defined types were placed in this encounter.       Vitals:    Vitals:    12/11/19 2246 12/11/19 2259   BP: (!) 126/92    Pulse: 88    Resp: 14    Temp: 98.3 ??F (36.8 ??C)    TempSrc: Oral    SpO2:  100%   Weight:  61.2 kg (135 lb)     -------------------------  BP: (!) 126/92, Temp: 98.3 ??F (36.8 ??C), Pulse: 88, Resp: 14      Re-evaluation Notes    Patient doing well on reevaluation.  No acute  changes.  Imaging negative for acute  findings.  Vital signs unremarkable.  Placed in a splint.  Advised to continue with ibuprofen or acetaminophen.  Advised to follow-up with her PCP if not improving or orthopedics/sports medicine.  Advised to return sooner if worse or for new or concerning symptoms.  They are comfortable with the plan.    The patient presented with wrist pain. A fracture was not detected on X-ray. The elbow was not affected and the hand is neurovascularly intact. No skin lesions were seen. There are no signs of compartment syndrome. The pulses are 2/4. The motor is 5/5. The sensation is intact. The patient was instructed to follow up in 2-3 days with their family doctor or orthopedics.  We also discussed returning to the Emergency Department immediately if new or worsening symptoms occur.  We have discussed the symptoms which are most concerning (e.g., increasing pain, numbness, weakness, color change or coldness to the extremity) that necessitate immediate return.      The patient understands that at this time there is no evidence for a more malignant underlying process, but the patient also understands that early in the process of an illness or injury, an emergency department workup can be falsely reassuring.  Routine discharge counseling was given, and the patient understands that worsening, changing or persistent symptoms should prompt an immediate call or follow up with their primary physician or return to the emergency department. The importance of appropriate follow up was also discussed.  I have reviewed the disposition diagnosis with the patient and or their family/guardian.  I have answered their questions and given discharge instructions.  They voiced understanding of these instructions and did not have any further questions or complaints.      CONSULTS:    None    CRITICAL CARE:     None    PROCEDURES:    None    FINAL IMPRESSION      1. Strain of right elbow, initial encounter    2. Strain of right wrist, initial  encounter          DISPOSITION/PLAN   DISPOSITION Decision To Discharge 12/12/2019 12:25:23 AM      Condition on Disposition    Improved    PATIENT REFERRED TO:  Your doctor    Schedule an appointment as soon as possible for a visit in 3 days        DISCHARGE MEDICATIONS:  Current Discharge Medication List          (Please note that portions of this note were completed with a voice recognition program.  Efforts were made to edit the dictations but occasionally words are mis-transcribed.)    Lillard Anes, DO  Attending Emergency Physician       Lillard Anes, DO  12/12/19 0127

## 2019-12-11 NOTE — ED Notes (Signed)
Report given to [facility], RN from ED.   Report method in person   The following was reviewed with receiving RN:   Current vital signs:  BP (!) 126/92    Pulse 88    Temp 98.3 ??F (36.8 ??C) (Oral)    Resp 14    LMP 11/02/2019 (Approximate)                MEWS Score: 0     Any medication or safety alerts were reviewed. Any pending diagnostics and notifications were also reviewed, as well as any safety concerns or issues, abnormal labs, abnormal imaging, and abnormal assessment findings. Questions were answered.          Shela Leff, RN  12/11/19 2258

## 2019-12-11 NOTE — ED Notes (Signed)
Voided urine collected/labeled/ in room if needed     Shela Leff, RN  12/11/19 2304

## 2019-12-11 NOTE — ED Notes (Signed)
Report received, care assumed      [employer], RN  12/11/19 2258

## 2019-12-12 ENCOUNTER — Inpatient Hospital Stay
Admit: 2019-12-12 | Discharge: 2019-12-12 | Disposition: A | Discharge status: Home / Self Care | Payer: PRIVATE HEALTH INSURANCE | Attending: Emergency Medicine

## 2019-12-12 NOTE — Discharge Instructions (Signed)
PLEASE RETURN TO THE EMERGENCY DEPARTMENT IMMEDIATELY if your symptoms worsen in anyway or in 1-2 days if not improved for re-evaluation.  You should immediately return to the ER for symptoms such as new or worsening pain, fever, numbness or weakness to the arms or legs, coolness or color change of the arms or legs.      Take your medication as indicated and prescribed.  If you are given an antibiotic then, make sure you get the prescription filled and take the antibiotics until finished.      Please understand that at this time there is no evidence for a more serious underlying process, but that early in the process of an illness or injury, an emergency department workup can be falsely reassuring.  You should contact your family doctor within the next 48 hours for a follow up appointment as X-rays may not show an occult fracture for several days    THANK YOU!!!    From [facility] and [facility]    On behalf of the Emergency Department staff at [facility], I would like to thank you for giving us the opportunity to address your health care needs and concerns.    We hope that during your visit, our service was delivered in a professional and caring manner. Please keep [facility] in mind as we walk with you down the path to your own personal wellness.     Please expect an automated text message or email from us so we can ask a few questions about your health and progress. Based on your answers, a clinician may call you back to offer help and instructions.    Please understand that early in the process of an illness or injury, an emergency department workup can be falsely reassuring.  If you notice any worsening, changing or persistent symptoms please call your family doctor or return to the ER immediately.     Tell us how we did during your visit at [email or web address]   and let us know about your experience

## 2020-06-17 ENCOUNTER — Other Ambulatory Visit: Payer: Self-pay | Admitting: Psychiatry

## 2020-06-17 DIAGNOSIS — F3181 Bipolar II disorder: Secondary | ICD-10-CM

## 2020-06-20 NOTE — Telephone Encounter (Signed)
Last visit 06/2019  ADMIN: please get patient scheduled

## 2020-07-05 ENCOUNTER — Encounter: Payer: Self-pay | Admitting: Psychiatry

## 2020-07-24 ENCOUNTER — Other Ambulatory Visit: Payer: Self-pay | Admitting: Psychiatry

## 2020-07-24 DIAGNOSIS — F3181 Bipolar II disorder: Secondary | ICD-10-CM

## 2020-07-24 NOTE — Telephone Encounter (Signed)
Still needs apt

## 2020-08-12 ENCOUNTER — Other Ambulatory Visit: Payer: Self-pay | Admitting: Psychiatry

## 2020-08-12 DIAGNOSIS — F3181 Bipolar II disorder: Secondary | ICD-10-CM

## 2020-08-15 NOTE — Telephone Encounter (Signed)
No apt

## 2020-08-15 NOTE — Telephone Encounter (Signed)
Patient leaves no option except to taper off Lamictal as she does not keep telehealth appt or even better obtain provider at new residence location. The dose is reduced as my retirement is soon and will have no provider to taper to discontinue otherwise.
